# Patient Record
Sex: Female | Born: 1980 | Race: White | Hispanic: No | Marital: Single | Smoking: Never smoker
Health system: Southern US, Community
[De-identification: ages and names within clinical notes are randomized; demographics above are authoritative.]

## PROBLEM LIST (undated history)

## (undated) DIAGNOSIS — J45909 Unspecified asthma, uncomplicated: Secondary | ICD-10-CM

## (undated) DIAGNOSIS — Z8619 Personal history of other infectious and parasitic diseases: Secondary | ICD-10-CM

## (undated) DIAGNOSIS — T7840XA Allergy, unspecified, initial encounter: Secondary | ICD-10-CM

## (undated) HISTORY — DX: Personal history of other infectious and parasitic diseases: Z86.19

## (undated) HISTORY — DX: Unspecified asthma, uncomplicated: J45.909

## (undated) HISTORY — DX: Allergy, unspecified, initial encounter: T78.40XA

---

## 2018-01-16 ENCOUNTER — Encounter: Payer: Self-pay | Admitting: Physician Assistant

## 2018-01-16 ENCOUNTER — Ambulatory Visit (INDEPENDENT_AMBULATORY_CARE_PROVIDER_SITE_OTHER): Payer: BLUE CROSS/BLUE SHIELD | Admitting: Physician Assistant

## 2018-01-16 VITALS — BP 124/80 | HR 74 | Temp 98.3°F | Ht 64.0 in | Wt 163.5 lb

## 2018-01-16 DIAGNOSIS — F53 Postpartum depression: Secondary | ICD-10-CM

## 2018-01-16 DIAGNOSIS — D649 Anemia, unspecified: Secondary | ICD-10-CM

## 2018-01-16 DIAGNOSIS — O99345 Other mental disorders complicating the puerperium: Secondary | ICD-10-CM

## 2018-01-16 DIAGNOSIS — J4530 Mild persistent asthma, uncomplicated: Secondary | ICD-10-CM

## 2018-01-16 LAB — CBC WITH DIFFERENTIAL/PLATELET
Basophils Absolute: 0.1 10*3/uL (ref 0.0–0.1)
Basophils Relative: 0.9 % (ref 0.0–3.0)
EOS PCT: 4.4 % (ref 0.0–5.0)
Eosinophils Absolute: 0.3 10*3/uL (ref 0.0–0.7)
HCT: 35.6 % — ABNORMAL LOW (ref 36.0–46.0)
Hemoglobin: 11.9 g/dL — ABNORMAL LOW (ref 12.0–15.0)
LYMPHS ABS: 2.1 10*3/uL (ref 0.7–4.0)
Lymphocytes Relative: 27.7 % (ref 12.0–46.0)
MCHC: 33.5 g/dL (ref 30.0–36.0)
MCV: 80.1 fl (ref 78.0–100.0)
Monocytes Absolute: 0.4 10*3/uL (ref 0.1–1.0)
Monocytes Relative: 5.1 % (ref 3.0–12.0)
NEUTROS ABS: 4.6 10*3/uL (ref 1.4–7.7)
Neutrophils Relative %: 61.9 % (ref 43.0–77.0)
PLATELETS: 346 10*3/uL (ref 150.0–400.0)
RBC: 4.44 Mil/uL (ref 3.87–5.11)
RDW: 17.5 % — ABNORMAL HIGH (ref 11.5–15.5)
WBC: 7.5 10*3/uL (ref 4.0–10.5)

## 2018-01-16 MED ORDER — MONTELUKAST SODIUM 10 MG PO TABS: 10.0000 mg | ORAL_TABLET | Freq: Every day | ORAL | 11 refills | Status: DC

## 2018-01-16 MED ORDER — SERTRALINE HCL 25 MG PO TABS: 25.0000 mg | ORAL_TABLET | Freq: Every day | ORAL | 1 refills | Status: DC

## 2018-01-16 MED ORDER — ALBUTEROL SULFATE HFA 108 (90 BASE) MCG/ACT IN AERS: 2.0000 | INHALATION_SPRAY | Freq: Four times a day (QID) | RESPIRATORY_TRACT | 11 refills | Status: DC | PRN

## 2018-01-16 NOTE — Patient Instructions (Signed)
It was great to meet you!  Start the zoloft 25 mg daily.  Please call and make an appointment with a local ob-gyn.  If you develop any suicidal or homicidal thoughts, please tell someone and go to the ER.

## 2018-01-16 NOTE — Progress Notes (Signed)
Amber Morrison is a 37 y.o. female here to Establish Care.  I acted as a Neurosurgeon for [employer], PA-C Corky Mull, LPN  History of Present Illness:   Chief Complaint  Patient presents with  . Establish Care  . Asthma    Medication refills  . Post partum depression    Acute Concerns: Asthma --she has not had asthma since her 51s. She has done PFTs and has had multiple allergy testing and allergy shots.  She states that her allergy tests were positive for dust, pollen, other environmental allergies, as well as coffee.  The allergy shots that she had were not effective in the past.  She states that her mother is exercise-induced and also triggered by her allergies.  At one point she was told to take a steroid inhaler however she did not, she does not want to have to take it separate inhaler.  She has been on Singulair for a few years now.  Denies any nocturnal awakenings of dyspnea.  She is unable to tell me how frequently she uses her asthma inhaler.  Does not use more than twice a day. Post-partum depression--patient reports that she has ongoing issues with crying spells, lack of motivation, and periods of sadness since having her 59-month-old daughter.  She denies any prior history of depression.  She denies any issues currently with anxiety.  She is getting very little sleep currently.  She has never been on medications for depression.  She denies suicidal or homicidal thoughts.  She denies any thoughts of hurting her baby. Anemia --she has a history of postpartum anemia, and also had a hemorrhage.  She states that she is currently having heavy periods.  She is hoping to find an OB/GYN to discuss this.  In the past she has been on oral contraceptives, however none were effective in controlling her spotting.  She has never had an IUD.  Health Maintenance: Immunizations -- UTD Colonoscopy -- N/A Mammogram -- N/A PAP -- done 2018 normal per pt will obtain records Bone Density -- N/A Weight --  Weight: 163 lb 8 oz (74.2 kg)   Depression screen PHQ 2/9 01/16/2018  Decreased Interest 0  Down, Depressed, Hopeless 1  PHQ - 2 Score 1  Altered sleeping 2  Tired, decreased energy 2  Change in appetite 0  Feeling bad or failure about yourself  0  Trouble concentrating 1  Moving slowly or fidgety/restless 0  Suicidal thoughts 0  PHQ-9 Score 6  Difficult doing work/chores Somewhat difficult    No flowsheet data found.  Other providers/specialists: None  Past Medical History:  Diagnosis Date  . Allergy   . Asthma   . History of chicken pox      Social History   Socioeconomic History  . Marital status: Single    Spouse name: Not on file  . Number of children: Not on file  . Years of education: Not on file  . Highest education level: Not on file  Occupational History  . Not on file  Social Needs  . Financial resource strain: Not on file  . Food insecurity:    Worry: Not on file    Inability: Not on file  . Transportation needs:    Medical: Not on file    Non-medical: Not on file  Tobacco Use  . Smoking status: Never Smoker  . Smokeless tobacco: Never Used  Substance and Sexual Activity  . Alcohol use: Not Currently  . Drug use: Never  . Sexual  activity: Yes    Birth control/protection: None  Lifestyle  . Physical activity:    Days per week: Not on file    Minutes per session: Not on file  . Stress: Not on file  Relationships  . Social connections:    Talks on phone: Not on file    Gets together: Not on file    Attends religious service: Not on file    Active member of club or organization: Not on file    Attends meetings of clubs or organizations: Not on file    Relationship status: Not on file  . Intimate partner violence:    Fear of current or ex partner: Not on file    Emotionally abused: Not on file    Physically abused: Not on file    Forced sexual activity: Not on file  Other Topics Concern  . Not on file  Social History Narrative   4  months old --> Amber Morrison   Moved     Past Surgical History:  Procedure Laterality Date  . CESAREAN SECTION  09/03/2017    Family History  Problem Relation Age of Onset  . Diabetes Mother   . Miscarriages / [place] Mother   . Prostate cancer Father   . Hearing loss Father   . Hyperlipidemia Father   . Diabetes Maternal Grandfather   . Hyperlipidemia Maternal Grandfather   . Hypertension Maternal Grandfather   . Kidney disease Maternal Grandfather   . Prostate cancer Paternal Grandfather     No Known Allergies   Current Medications:   Current Outpatient Medications:  .  albuterol (PROVENTIL HFA;VENTOLIN HFA) 108 (90 Base) MCG/ACT inhaler, Inhale 2 puffs into the lungs every 6 (six) hours as needed for wheezing or shortness of breath., Disp: 1 Inhaler, Rfl: 11 .  loratadine (CLARITIN) 10 MG tablet, Take 10 mg by mouth daily., Disp: , Rfl:  .  montelukast (SINGULAIR) 10 MG tablet, Take 1 tablet (10 mg total) by mouth at bedtime., Disp: 30 tablet, Rfl: 11 .  sertraline (ZOLOFT) 25 MG tablet, Take 1 tablet (25 mg total) by mouth daily., Disp: 30 tablet, Rfl: 1   Review of Systems:   ROS Negative unless otherwise specified per HPI.  Vitals:   Vitals:   01/16/18 0834  BP: 124/80  Pulse: 74  Temp: 98.3 F (36.8 C)  TempSrc: Oral  SpO2: 99%  Weight: 163 lb 8 oz (74.2 kg)  Height: 5\' 4"  (1.626 m)     Body mass index is 28.06 kg/m.  Physical Exam:   Physical Exam  Constitutional: She appears well-developed. She is cooperative.  Non-toxic appearance. She does not have a sickly appearance. She does not appear ill. No distress.  Cardiovascular: Normal rate, regular rhythm, S1 normal, S2 normal, normal heart sounds and normal pulses.  No LE edema  Pulmonary/Chest: Effort normal and breath sounds normal.  Neurological: She is alert. GCS eye subscore is 4. GCS verbal subscore is 5. GCS motor subscore is 6.  Skin: Skin is warm, dry and intact.  Psychiatric: She has a  normal mood and affect. Her speech is normal and behavior is normal.  Nursing note and vitals reviewed.   Assessment and Plan:    Amber Morrison was seen today for establish care, asthma and post partum depression.  Diagnoses and all orders for this visit:  Anemia, unspecified type Will recheck a CBC today.  I do think that she needs to be seen by an OB/GYN for further evaluation of her heavy  bleeding.  Provided her with a list of OB/GYN's in the area.  We will contact her with her CBC results. -     CBC with Differential/Platelet  Post partum depression Current score is 9.  She is agreeable to starting Zoloft 25 mg.  We will have her follow-up in 4 to 6 weeks. I discussed with patient that if they develop any SI, to tell someone immediately and seek medical attention.  Mild persistent asthma without complication  I refilled her Albuterol and Singulair today.  Continue Claritin.  She will let us know if her symptoms worsen in the meantime.  Other orders -     sertraline (ZOLOFT) 25 MG tablet; Take 1 tablet (25 mg total) by mouth daily. -     montelukast (SINGULAIR) 10 MG tablet; Take 1 tablet (10 mg total) by mouth at bedtime. -     albuterol (PROVENTIL HFA;VENTOLIN HFA) 108 (90 Base) MCG/ACT inhaler; Inhale 2 puffs into the lungs every 6 (six) hours as needed for wheezing or shortness of breath.   . Reviewed expectations re: course of current medical issues. . Discussed self-management of symptoms. . Outlined signs and symptoms indicating need for more acute intervention. . Patient verbalized understanding and all questions were answered. . See orders for this visit as documented in the electronic medical record. . Patient received an After-Visit Summary.   CMA or LPN served as scribe during this visit. History, Physical, and Plan performed by medical provider. Documentation and orders reviewed and attested to.  Jarold Motto, PA-C

## 2018-02-27 ENCOUNTER — Ambulatory Visit: Payer: Self-pay | Admitting: Physician Assistant

## 2018-03-06 ENCOUNTER — Encounter: Payer: Self-pay | Admitting: Physician Assistant

## 2018-03-06 ENCOUNTER — Ambulatory Visit: Payer: BLUE CROSS/BLUE SHIELD | Admitting: Physician Assistant

## 2018-03-06 VITALS — BP 124/80 | HR 68 | Temp 98.4°F | Ht 64.0 in | Wt 160.5 lb

## 2018-03-06 DIAGNOSIS — F53 Postpartum depression: Secondary | ICD-10-CM | POA: Diagnosis not present

## 2018-03-06 DIAGNOSIS — O99345 Other mental disorders complicating the puerperium: Secondary | ICD-10-CM | POA: Diagnosis not present

## 2018-03-06 DIAGNOSIS — Z23 Encounter for immunization: Secondary | ICD-10-CM | POA: Diagnosis not present

## 2018-03-06 DIAGNOSIS — N946 Dysmenorrhea, unspecified: Secondary | ICD-10-CM | POA: Diagnosis not present

## 2018-03-06 MED ORDER — SERTRALINE HCL 25 MG PO TABS: 25.0000 mg | ORAL_TABLET | Freq: Every day | ORAL | 1 refills | Status: DC

## 2018-03-06 NOTE — Patient Instructions (Signed)
It was great to see you!  Continue Zoloft 25 mg.  Please make an appointment with an Ob-Gyn, I have put a referral in.  Let's follow-up in 6 months, sooner if you have concerns.  Take care,  Jarold MottoSamantha Braylen Staller PA-C

## 2018-03-06 NOTE — Progress Notes (Signed)
Amber Morrison is a 37 y.o. female is here to follow up on post partum depression.  I acted as a Neurosurgeon for [employer], PA-C Corky Mull, LPN  History of Present Illness:   Chief Complaint  Patient presents with  . Post Partum Depression    HPI   Post-partum depression Currently doing very well on Zoloft 25 mg. Denies SI/HI. Crying spells have significantly decreased, but denies feeling "like a zombie." Did not have any side effects from starting Zoloft. Overall feels much improved on this medication.  Edinburgh Postnatal Depression Scale - 03/06/18 1000      Edinburgh Postnatal Depression Scale:  In the Past 7 Days   I have been able to laugh and see the funny side of things.  0    I have looked forward with enjoyment to things.  0    I have blamed myself unnecessarily when things went wrong.  2    I have been anxious or worried for no good reason.  1    I have felt scared or panicky for no good reason.  0    Things have been getting on top of me.  1    I have been so unhappy that I have had difficulty sleeping.  0    I have felt sad or miserable.  1    I have been so unhappy that I have been crying.  0    The thought of harming myself has occurred to me.  0    Edinburgh Postnatal Depression Scale Total  5        Dysmenorrhea Continues to have heavy and painful periods. Has not reached out to ob-gyn yet. Denies lightheadedness, fatigue, dizziness, syncope. Takes ibuprofen to help with her pain.   There are no preventive care reminders to display for this patient.  Past Medical History:  Diagnosis Date  . Allergy   . Asthma   . History of chicken pox      Social History   Socioeconomic History  . Marital status: Single    Spouse name: Not on file  . Number of children: Not on file  . Years of education: Not on file  . Highest education level: Not on file  Occupational History  . Not on file  Social Needs  . Financial resource strain: Not on file  .  Food insecurity:    Worry: Not on file    Inability: Not on file  . Transportation needs:    Medical: Not on file    Non-medical: Not on file  Tobacco Use  . Smoking status: Never Smoker  . Smokeless tobacco: Never Used  Substance and Sexual Activity  . Alcohol use: Not Currently  . Drug use: Never  . Sexual activity: Yes    Birth control/protection: None  Lifestyle  . Physical activity:    Days per week: Not on file    Minutes per session: Not on file  . Stress: Not on file  Relationships  . Social connections:    Talks on phone: Not on file    Gets together: Not on file    Attends religious service: Not on file    Active member of club or organization: Not on file    Attends meetings of clubs or organizations: Not on file    Relationship status: Not on file  . Intimate partner violence:    Fear of current or ex partner: Not on file    Emotionally abused: Not  on file    Physically abused: Not on file    Forced sexual activity: Not on file  Other Topics Concern  . Not on file  Social History Narrative   254 month old daughter   [place]   Husband works in [employer]   She is an Charity fundraiserN at [employer]    Past Surgical History:  Procedure Laterality Date  . CESAREAN SECTION  09/03/2017    Family History  Problem Relation Age of Onset  . Diabetes Mother   . Miscarriages / [place] Mother   . Prostate cancer Father   . Hearing loss Father   . Hyperlipidemia Father   . Diabetes Maternal Grandfather   . Hyperlipidemia Maternal Grandfather   . Hypertension Maternal Grandfather   . Kidney disease Maternal Grandfather   . Prostate cancer Paternal Grandfather     PMHx, SurgHx, SocialHx, FamHx, Medications, and Allergies were reviewed in the Visit Navigator and updated as appropriate.   Patient Active Problem List   Diagnosis Date Noted  . Post partum depression 01/16/2018  . Anemia 01/16/2018  . Mild persistent asthma without complication 01/16/2018     Social History   Tobacco Use  . Smoking status: Never Smoker  . Smokeless tobacco: Never Used  Substance Use Topics  . Alcohol use: Not Currently  . Drug use: Never    Current Medications and Allergies:    Current Outpatient Medications:  .  albuterol (PROVENTIL HFA;VENTOLIN HFA) 108 (90 Base) MCG/ACT inhaler, Inhale 2 puffs into the lungs every 6 (six) hours as needed for wheezing or shortness of breath., Disp: 1 Inhaler, Rfl: 11 .  loratadine (CLARITIN) 10 MG tablet, Take 10 mg by mouth daily., Disp: , Rfl:  .  montelukast (SINGULAIR) 10 MG tablet, Take 1 tablet (10 mg total) by mouth at bedtime., Disp: 30 tablet, Rfl: 11 .  sertraline (ZOLOFT) 25 MG tablet, Take 1 tablet (25 mg total) by mouth daily., Disp: 90 tablet, Rfl: 1  No Known Allergies  Review of Systems   ROS  Negative unless otherwise specified per HPI.  Vitals:   Vitals:   03/06/18 0900  BP: 124/80  Pulse: 68  Temp: 98.4 F (36.9 C)  TempSrc: Oral  SpO2: 99%  Weight: 160 lb 8 oz (72.8 kg)  Height: 5\' 4"  (1.626 m)     Body mass index is 27.55 kg/m.   Physical Exam:    Physical Exam  Constitutional: She appears well-developed. She is cooperative.  Non-toxic appearance. She does not have a sickly appearance. She does not appear ill. No distress.  Cardiovascular: Normal rate, regular rhythm, S1 normal, S2 normal, normal heart sounds and normal pulses.  No LE edema  Pulmonary/Chest: Effort normal and breath sounds normal.  Neurological: She is alert. GCS eye subscore is 4. GCS verbal subscore is 5. GCS motor subscore is 6.  Skin: Skin is warm, dry and intact.  Psychiatric: She has a normal mood and affect. Her speech is normal and behavior is normal.  Nursing note and vitals reviewed.    Assessment and Plan:    [name] was seen today for post partum depression.  Diagnoses and all orders for this visit:  Post partum depression Doing very well on Zoloft 25 mg. Continue. Follow-up in 6 months,  sooner if concerns.  Need for prophylactic vaccination and inoculation against influenza -     Flu Vaccine QUAD 36+ mos IM  Dysmenorrhea Referral to ob-gyn. I recommended that she schedule an appointment when  they reach out to her. -     Ambulatory referral to Obstetrics / Gynecology  Other orders -     sertraline (ZOLOFT) 25 MG tablet; Take 1 tablet (25 mg total) by mouth daily.   . Reviewed expectations re: course of current medical issues. . Discussed self-management of symptoms. . Outlined signs and symptoms indicating need for more acute intervention. . Patient verbalized understanding and all questions were answered. . See orders for this visit as documented in the electronic medical record. . Patient received an After Visit Summary.  CMA or LPN served as scribe during this visit. History, Physical, and Plan performed by medical provider. The above documentation has been reviewed and is accurate and complete.   Jarold Motto, PA-C , [place] [facility] 03/06/2018  Follow-up: No follow-ups on file.

## 2018-03-29 ENCOUNTER — Encounter: Payer: BLUE CROSS/BLUE SHIELD | Admitting: Family Medicine

## 2018-04-17 ENCOUNTER — Ambulatory Visit (INDEPENDENT_AMBULATORY_CARE_PROVIDER_SITE_OTHER): Payer: BLUE CROSS/BLUE SHIELD | Admitting: Obstetrics & Gynecology

## 2018-04-17 ENCOUNTER — Encounter: Payer: Self-pay | Admitting: Obstetrics & Gynecology

## 2018-04-17 VITALS — BP 120/81 | HR 72 | Wt 160.2 lb

## 2018-04-17 DIAGNOSIS — N920 Excessive and frequent menstruation with regular cycle: Secondary | ICD-10-CM

## 2018-04-17 DIAGNOSIS — N946 Dysmenorrhea, unspecified: Secondary | ICD-10-CM

## 2018-04-17 MED ORDER — NAPROXEN 500 MG PO TABS: 500.0000 mg | ORAL_TABLET | Freq: Two times a day (BID) | ORAL | 2 refills | Status: AC

## 2018-04-17 MED ORDER — TRANEXAMIC ACID 650 MG PO TABS: 1300.0000 mg | ORAL_TABLET | Freq: Three times a day (TID) | ORAL | 2 refills | Status: DC

## 2018-04-17 NOTE — Progress Notes (Signed)
GYNECOLOGY OFFICE VISIT NOTE  History:  37 y.o. F here today for management of painful and heavy menstrual periods. Had this problem since age 26, was told by former GYN docotr that she likely had endometriosis and was treated with OCPs. She had headaches and other side effects with OCPs and stopped these medications. Symptoms continued, especially since birth of her daugther 7 months ago. Periods are heavier now, soaks through pads in 2 hours. Denies lightheadedness, fatigue, dizziness, syncope. Takes Ibuprofen to help with her pain. Denies pain during intercourse. Her mother and aunt had hysterectomies due to similar symptoms and are both on HRT; she does not desire surgery but wants to talk about less invasive management modalities. Using vasectomy for birth control, does not want to do hormonal therapy unless absolutely needed.  She denies any current abnormal vaginal discharge, bleeding, pelvic pain or other concerns.   Past Medical History:  Diagnosis Date  . Allergy   . Asthma   . History of chicken pox     Past Surgical History:  Procedure Laterality Date  . CESAREAN SECTION  09/03/2017    The following portions of the patient's history were reviewed and updated as appropriate: allergies, current medications, past family history, past medical history, past social history, past surgical history and problem list.   Health Maintenance:  Normal pap in 2018.  Review of Systems:  Pertinent items noted in HPI and remainder of comprehensive ROS otherwise negative.  Objective:  Physical Exam BP 120/81   Pulse 72   Wt 160 lb 3.2 oz (72.7 kg)   LMP 03/28/2018   BMI 27.50 kg/m  CONSTITUTIONAL: Well-developed, well-nourished female in no acute distress.  HEENT:  Normocephalic, atraumatic. External right and left ear normal. No scleral icterus.  NECK: Normal range of motion, supple, no masses noted on observation SKIN: Skin is warm and dry. No rash noted. Not diaphoretic. No  erythema. No pallor. MUSCULOSKELETAL: Normal range of motion. No edema noted. NEUROLOGIC: Alert and oriented to person, place, and time. Normal muscle tone coordination. No cranial nerve deficit noted. PSYCHIATRIC: Normal mood and affect. Normal behavior. Normal judgment and thought content. CARDIOVASCULAR: Normal heart rate noted RESPIRATORY: Effort and breath sounds normal, no problems with respiration noted ABDOMEN: Soft, no overt distention noted.   PELVIC: Deferred  Assessment & Plan:  1. Menorrhagia with regular cycle 2. Dysmenorrhea Concerned about endometriosis, but will evaluate for other etiologies via ultrasound which was scheduled on 04/19/18, will follow up results and manage accordingly.  Discussed management modalities in detail: Naproxen and Lysteda; continuous birth control pills; Orilissa; Progestin IUD; Endometrial ablation (Minerva or Hydrothermal Ablation) or Hysterectomy (plan to leave ovaries in place unless severe endometriosis seen). All questions answered. She wants Naproxen and Lysteda for now, will see if this is adequate to control her symptoms (especially since future fertility is not desired, no indication for hormonal therapy now etc).  - US PELVIC COMPLETE WITH TRANSVAGINAL; Future - tranexamic acid (LYSTEDA) 650 MG TABS tablet; Take 2 tablets (1,300 mg total) by mouth 3 (three) times daily. Take during menses for a maximum of five days  Dispense: 30 tablet; Refill: 2 - naproxen (NAPROSYN) 500 MG tablet; Take 1 tablet (500 mg total) by mouth 2 (two) times daily with a meal. As needed for pain  Dispense: 60 tablet; Refill: 2 Routine preventative health maintenance measures emphasized. Please refer to After Visit Summary for other counseling recommendations.    Return in about 2 months (around 06/17/2018) for Followup for  dysmenorrhea.   Total face-to-face time with patient: 20 minutes.  Over 50% of encounter was spent on counseling and coordination of  care.   Jaynie Collins, MD, FACOG Obstetrician & Gynecologist, [facility] [employer], [facility]

## 2018-04-17 NOTE — Patient Instructions (Signed)
Naproxen and Lysteda  Continuous birth control pills  Amber Morrison IUD  Endometrial ablation (Minerva or Hydrothermal Ablation)  Hysterectomy (plan to leave ovaries in place unless severe endometriosis seen)

## 2018-04-19 ENCOUNTER — Ambulatory Visit (HOSPITAL_COMMUNITY)
Admission: RE | Admit: 2018-04-19 | Discharge: 2018-04-19 | Disposition: A | Discharge status: Home / Self Care | Source: Ambulatory Visit | Attending: Obstetrics & Gynecology | Admitting: Obstetrics & Gynecology

## 2018-04-19 DIAGNOSIS — N920 Excessive and frequent menstruation with regular cycle: Secondary | ICD-10-CM | POA: Diagnosis present

## 2018-04-19 DIAGNOSIS — N946 Dysmenorrhea, unspecified: Secondary | ICD-10-CM

## 2018-04-19 DIAGNOSIS — N800 Endometriosis of uterus: Secondary | ICD-10-CM | POA: Insufficient documentation

## 2018-06-15 ENCOUNTER — Ambulatory Visit: Admitting: Obstetrics & Gynecology

## 2018-09-04 ENCOUNTER — Ambulatory Visit: Payer: BLUE CROSS/BLUE SHIELD | Admitting: Physician Assistant

## 2018-09-20 ENCOUNTER — Telehealth: Payer: Self-pay | Admitting: *Deleted

## 2018-09-20 NOTE — Telephone Encounter (Signed)
Spoke to Amber Morrison told her calling to schedule follow up with [facility] for PPD. Amber Morrison said she is no longer taking the medication and she has to change to a [place] provider due to insurance. Told her okay I will let Lelon Mast know and take care.

## 2018-12-01 ENCOUNTER — Ambulatory Visit (INDEPENDENT_AMBULATORY_CARE_PROVIDER_SITE_OTHER): Payer: Commercial Managed Care - PPO | Admitting: Physician Assistant

## 2018-12-01 ENCOUNTER — Encounter: Payer: Self-pay | Admitting: Physician Assistant

## 2018-12-01 DIAGNOSIS — J4530 Mild persistent asthma, uncomplicated: Secondary | ICD-10-CM

## 2018-12-01 MED ORDER — MONTELUKAST SODIUM 10 MG PO TABS: 10.0000 mg | ORAL_TABLET | Freq: Every day | ORAL | 11 refills | Status: DC

## 2018-12-01 MED ORDER — ALBUTEROL SULFATE HFA 108 (90 BASE) MCG/ACT IN AERS: 2.0000 | INHALATION_SPRAY | Freq: Four times a day (QID) | RESPIRATORY_TRACT | 11 refills | Status: DC | PRN

## 2018-12-01 NOTE — Progress Notes (Signed)
Virtual Visit via Video   I connected with Amber Morrison on 12/01/18 at  9:20 AM EDT by a video enabled telemedicine application and verified that I am speaking with the correct person using two identifiers. Location patient: Home Location provider: [facility], Office Persons participating in the virtual visit: Johnette Abrahamrin Moudy, Kaleena Corrow PA-C, Corky Mullonna Orphanos, LPN   I discussed the limitations of evaluation and management by telemedicine and the availability of in person appointments. The patient expressed understanding and agreed to proceed.  I acted as a Neurosurgeonscribe for Energy East CorporationSamantha Danial Hlavac, [employer]on ProductsPA-C Donna Orphanos, LPN  Subjective:   HPI:  Asthma Pt is needing refills on Albuterol Inhaler and singulair. She is not having any complications at this time from her asthma, thinks it is allergy related and wearing a mask for 12 hours at work. She is an L&D nurse. Denies SOB, wheezing, nasal discharge. She requires use of inhaler after heavy rains. Wears a mask at work with removable filter that she cleans/replaces regularly.  ROS: See pertinent positives and negatives per HPI.  Patient Active Problem List   Diagnosis Date Noted  . Post partum depression 01/16/2018  . Anemia 01/16/2018  . Mild persistent asthma without complication 01/16/2018    Social History   Tobacco Use  . Smoking status: Never Smoker  . Smokeless tobacco: Never Used  Substance Use Topics  . Alcohol use: Not Currently    Current Outpatient Medications:  .  albuterol (VENTOLIN HFA) 108 (90 Base) MCG/ACT inhaler, Inhale 2 puffs into the lungs every 6 (six) hours as needed for wheezing or shortness of breath., Disp: 1 Inhaler, Rfl: 11 .  loratadine (CLARITIN) 10 MG tablet, Take 10 mg by mouth daily., Disp: , Rfl:  .  montelukast (SINGULAIR) 10 MG tablet, Take 1 tablet (10 mg total) by mouth at bedtime., Disp: 30 tablet, Rfl: 11 .  naproxen (NAPROSYN) 500 MG tablet, Take 1 tablet (500 mg total) by mouth 2 (two) times daily with a  meal. As needed for pain, Disp: 60 tablet, Rfl: 2  Allergies  Allergen Reactions  . Coffee Bean Extract  [Coffea Arabica] Diarrhea, Nausea And Vomiting and Other (See Comments)    Objective:   VITALS: Per patient if applicable, see vitals. GENERAL: Alert, appears well and in no acute distress. HEENT: Atraumatic, conjunctiva clear, no obvious abnormalities on inspection of external nose and ears. NECK: Normal movements of the head and neck. CARDIOPULMONARY: No increased WOB. Speaking in clear sentences. I:E ratio WNL.  MS: Moves all visible extremities without noticeable abnormality. PSYCH: Pleasant and cooperative, well-groomed. Speech normal rate and rhythm. Affect is appropriate. Insight and judgement are appropriate. Attention is focused, linear, and appropriate.  NEURO: CN grossly intact. Oriented as arrived to appointment on time with no prompting. Moves both UE equally.  SKIN: No obvious lesions, wounds, erythema, or cyanosis noted on face or hands.  Assessment and Plan:   [name] was seen today for asthma and medication refill.  Diagnoses and all orders for this visit:  Mild persistent asthma without complication Well controlled. Refill on singulair and albuterol. Follow-up as needed, especially if using albuterol >2-3 x a week.  Other orders -     montelukast (SINGULAIR) 10 MG tablet; Take 1 tablet (10 mg total) by mouth at bedtime. -     albuterol (VENTOLIN HFA) 108 (90 Base) MCG/ACT inhaler; Inhale 2 puffs into the lungs every 6 (six) hours as needed for wheezing or shortness of breath.    . Reviewed expectations  re: course of current medical issues. . Discussed self-management of symptoms. . Outlined signs and symptoms indicating need for more acute intervention. . Patient verbalized understanding and all questions were answered. Marland Kitchen Health Maintenance issues including appropriate healthy diet, exercise, and smoking avoidance were discussed with patient. . See orders for  this visit as documented in the electronic medical record.  I discussed the assessment and treatment plan with the patient. The patient was provided an opportunity to ask questions and all were answered. The patient agreed with the plan and demonstrated an understanding of the instructions.   The patient was advised to call back or seek an in-person evaluation if the symptoms worsen or if the condition fails to improve as anticipated.   CMA or LPN served as scribe during this visit. History, Physical, and Plan performed by medical provider. The above documentation has been reviewed and is accurate and complete.   [place], [place] 12/01/2018

## 2019-02-27 ENCOUNTER — Telehealth: Payer: Self-pay | Admitting: Physician Assistant

## 2019-02-27 ENCOUNTER — Other Ambulatory Visit: Payer: Self-pay | Admitting: Physician Assistant

## 2019-02-27 MED ORDER — MONTELUKAST SODIUM 10 MG PO TABS: 10.0000 mg | ORAL_TABLET | Freq: Every day | ORAL | 11 refills | Status: DC

## 2019-02-27 MED ORDER — MONTELUKAST SODIUM 10 MG PO TABS: 10.0000 mg | ORAL_TABLET | Freq: Every day | ORAL | 11 refills | Status: AC

## 2019-02-27 NOTE — Telephone Encounter (Signed)
montelukast (SINGULAIR) 10 MG tablet  This was filled and sent to [facility] pt requested for it to be  [facility] out -patient pharmacy! [place]... she now has to go thru [facility] for her ins. Please re-route

## 2019-02-27 NOTE — Telephone Encounter (Signed)
Copied from [place] [phone]. Topic: Quick Communication - Rx Refill/Question >> Feb 27, 2019  1:32 PM Izola Price, [place] A wrote: Medication: montelukast (SINGULAIR) 10 MG tablet (Patient would like refill and medication sent to different pharmacy.)  Has the patient contacted their pharmacy? Yes (Agent: If no, request that the patient contact the pharmacy for the refill.) (Agent: If yes, when and what did the pharmacy advise?)Contact PCP  Preferred Pharmacy (with phone number or street name): [place] [facility],  [place]  [phone]  Agent: Please be advised that RX refills may take up to 3 business days. We ask that you follow-up with your pharmacy.

## 2019-02-27 NOTE — Telephone Encounter (Signed)
Medication resent to [facility] 

## 2019-03-06 ENCOUNTER — Telehealth: Payer: Self-pay | Admitting: Physician Assistant

## 2019-03-06 MED ORDER — ALBUTEROL SULFATE HFA 108 (90 BASE) MCG/ACT IN AERS: 2.0000 | INHALATION_SPRAY | Freq: Four times a day (QID) | RESPIRATORY_TRACT | 2 refills | Status: AC | PRN

## 2019-03-06 NOTE — Telephone Encounter (Signed)
Pt notified Rx for Albuterol Inhaler was sent to pharmacy as requested.

## 2019-03-06 NOTE — Telephone Encounter (Signed)
One of the Pts meds were transferred over to the new pharmacy she requested but she is still waiting on the Rx for albuterol (VENTOLIN HFA) 108 (90 Base) MCG/ACT inhaler  To be transferred over as well/ please advise   [place], [place]

## 2020-01-13 IMAGING — US US PELVIS COMPLETE TRANSABD/TRANSVAG
2 series · 15 of 25 positions shown · non-contrast
Comparison: None

CLINICAL DATA: Menorrhagia.  Dysmenorrhea.

EXAM:
TRANSABDOMINAL AND TRANSVAGINAL ULTRASOUND OF PELVIS
TECHNIQUE: Both transabdominal and transvaginal ultrasound examinations of the
pelvis were performed. Transabdominal technique was performed for
global imaging of the pelvis including uterus, ovaries, adnexal
regions, and pelvic cul-de-sac. It was necessary to proceed with
endovaginal exam following the transabdominal exam to visualize the
endometrium and ovaries.

[Series 1: us pelvis complete transabd/transvag · 14 of 57 slices shown (1 of 2)]
[im 1/57]
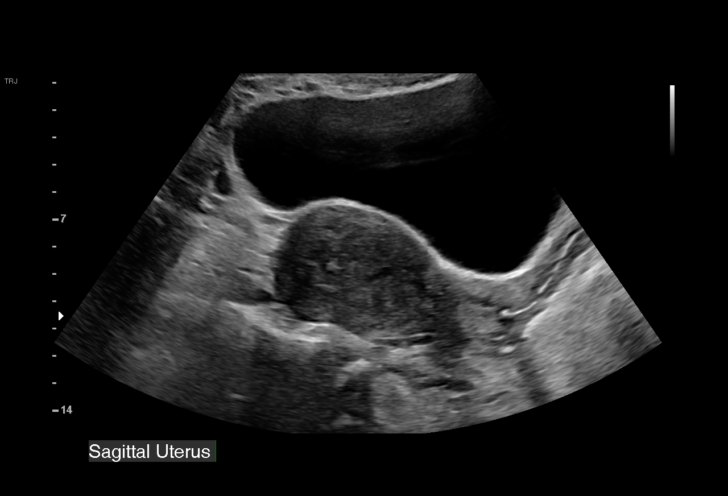
[im 5/57]
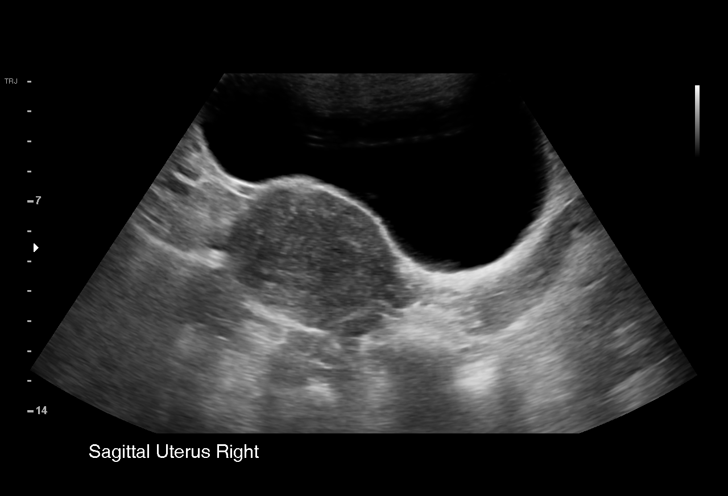
[im 10/57]
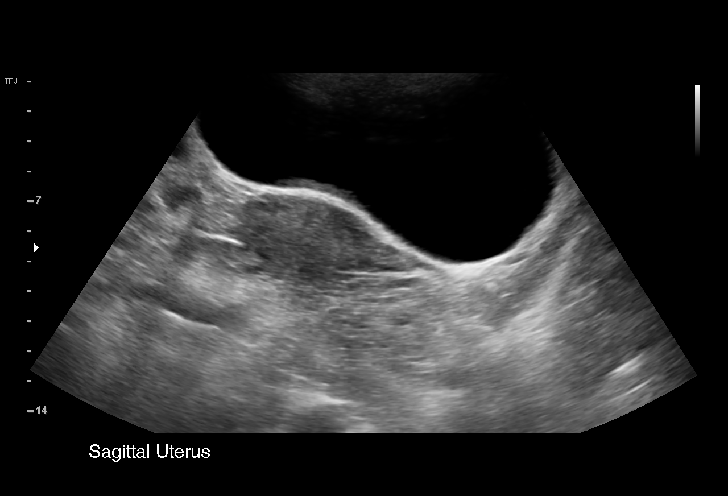
[im 13/57]
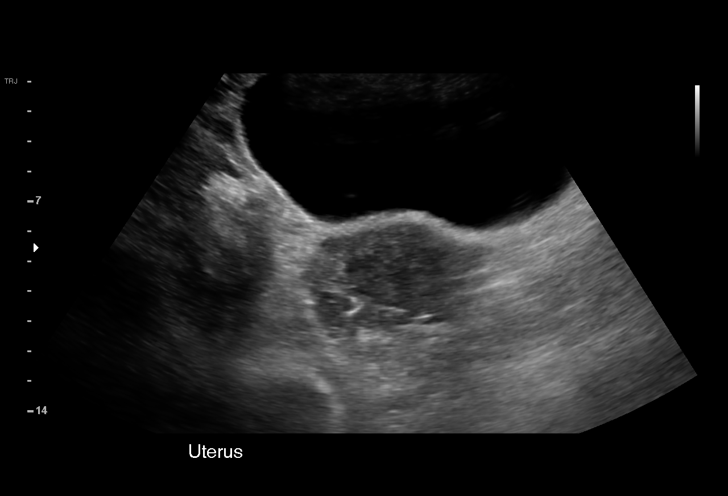
[im 18/57]
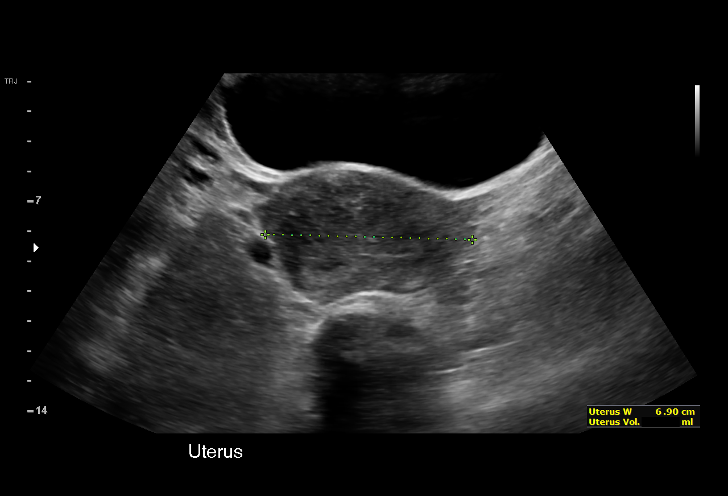
[im 22/57]
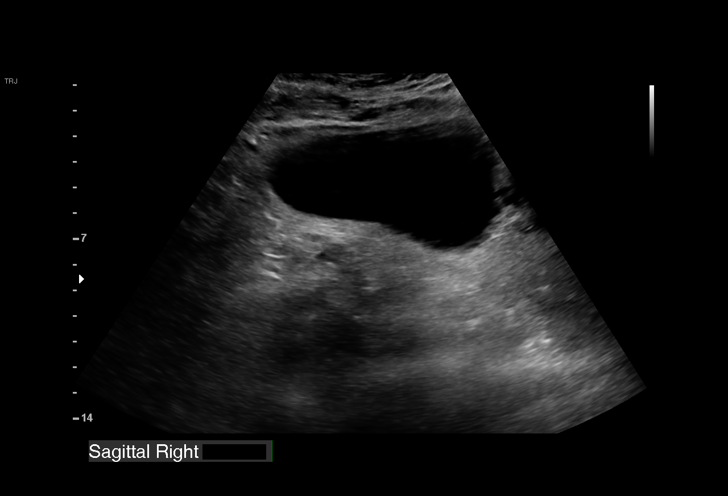
[im 25/57]
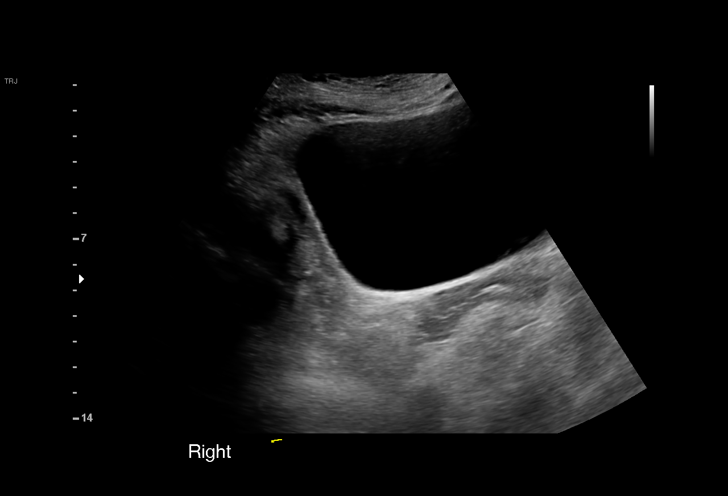
[im 30/57]
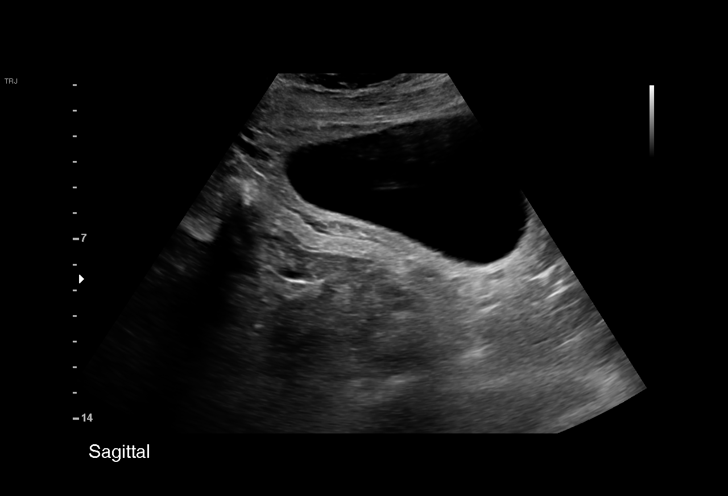
[im 35/57]
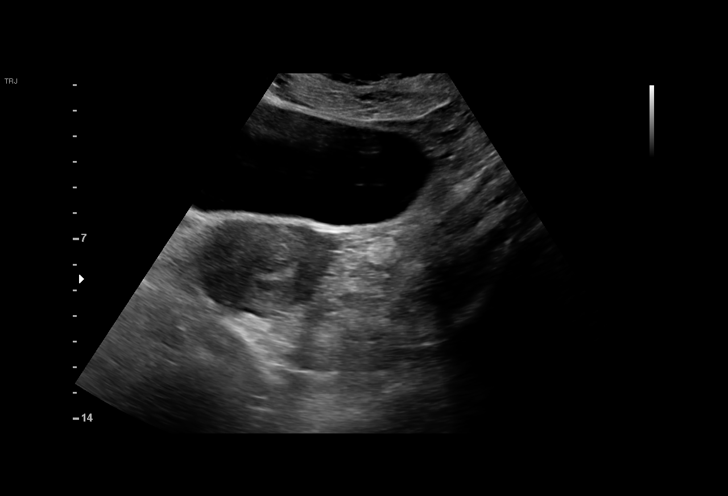
[im 37/57]
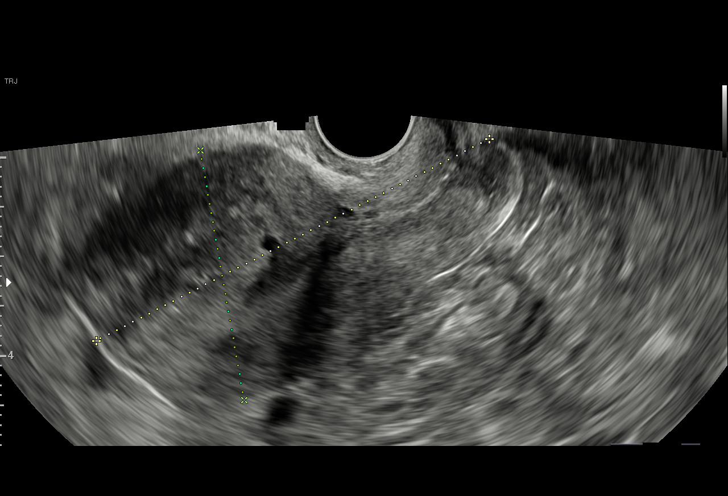
[im 42/57]
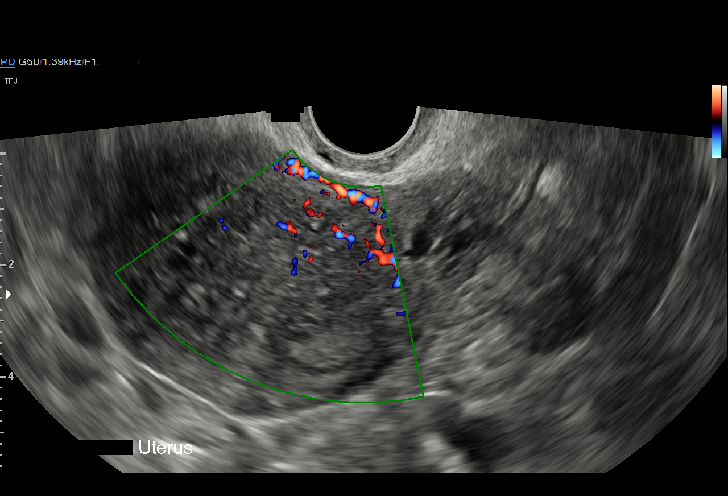
[im 47/57]
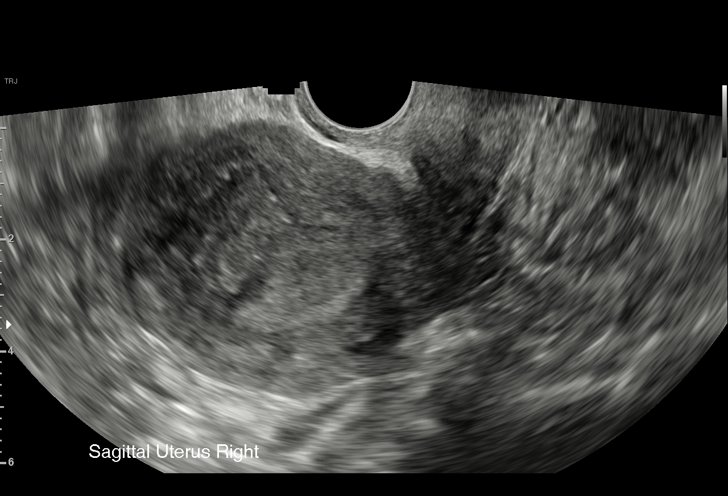
[im 49/57]
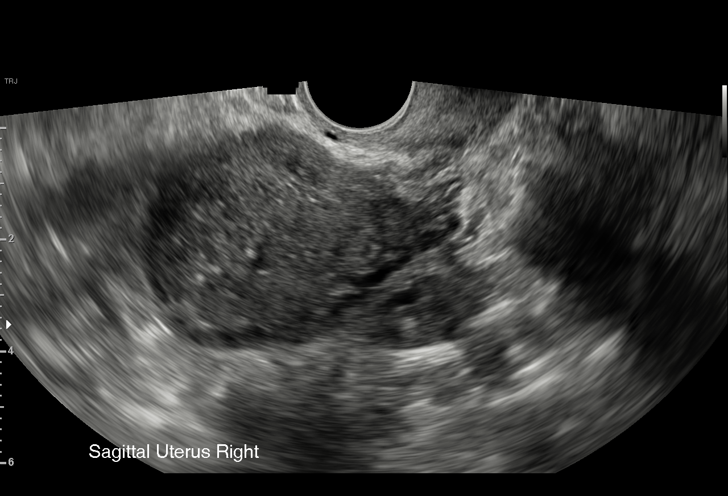
[im 54/57]
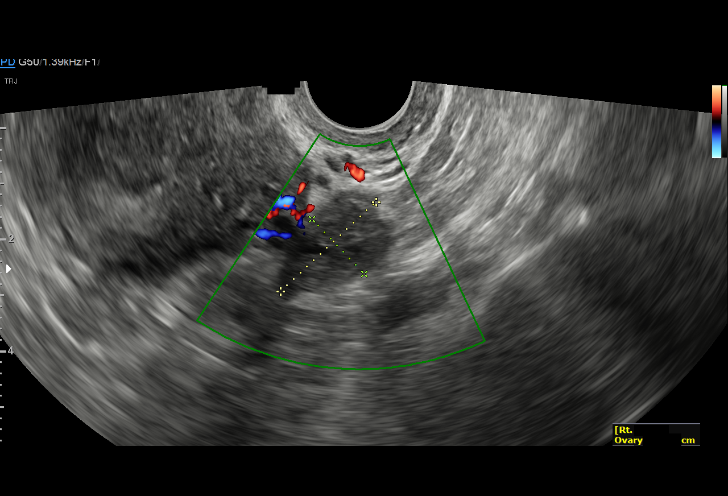

[Series 3: us pelvis complete transabd/transvag · 1 of 1 slices shown (2 of 2)]
[im 1/1]
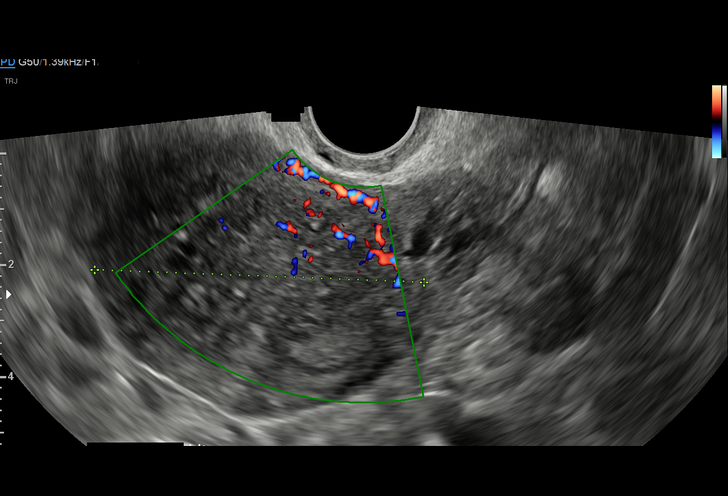

[15 of 25 positions shown; findings below may reference images not displayed]

FINDINGS: Uterus

Measurements: 9.3 x 4.8 x 6.4 cm = volume: 159mL. No fibroids
identified. An ill-defined area of heterogeneous echogenicity with
multiple cystic foci seen in the anterior uterine myometrium,
consistent with adenomyoma.

Endometrium

Thickness: 10 mm.  No focal abnormality visualized.

Right ovary

Measurements: 2.5 x 1.3 x 1.5 cm = volume: 2.5mL. Normal
appearance/no adnexal mass.

Left ovary

Measurements: 2.9 x 1.7 x 2.1 cm = volume: 8.1mL. Normal
appearance/no adnexal mass.

Other findings

No abnormal free fluid.
IMPRESSION: Uterine adenomyosis, with focal adenomyoma in the anterior wall. No
distinct fibroids identified.

Endometrial thickness measures 10 mm.

Normal appearance of both ovaries.

## 2022-03-15 ENCOUNTER — Encounter: Payer: Self-pay | Admitting: *Deleted

## 2022-06-03 ENCOUNTER — Encounter: Payer: Self-pay | Admitting: *Deleted
